# Patient Record
Sex: Male | Born: 1980 | Race: White | Hispanic: No | Marital: Married | Smoking: Never smoker
Health system: Southern US, Community
[De-identification: ages and names within clinical notes are randomized; demographics above are authoritative.]

## PROBLEM LIST (undated history)

## (undated) HISTORY — PX: SHOULDER SURGERY: SHX246

---

## 2013-03-31 ENCOUNTER — Encounter (HOSPITAL_BASED_OUTPATIENT_CLINIC_OR_DEPARTMENT_OTHER): Payer: Self-pay

## 2013-03-31 ENCOUNTER — Emergency Department (HOSPITAL_BASED_OUTPATIENT_CLINIC_OR_DEPARTMENT_OTHER)
Admission: EM | Admit: 2013-03-31 | Discharge: 2013-03-31 | Disposition: A | Discharge status: Home / Self Care | Payer: Managed Care, Other (non HMO) | Attending: Emergency Medicine | Admitting: Emergency Medicine

## 2013-03-31 ENCOUNTER — Emergency Department (HOSPITAL_BASED_OUTPATIENT_CLINIC_OR_DEPARTMENT_OTHER): Payer: Managed Care, Other (non HMO)

## 2013-03-31 DIAGNOSIS — Z880 Allergy status to penicillin: Secondary | ICD-10-CM | POA: Insufficient documentation

## 2013-03-31 DIAGNOSIS — R071 Chest pain on breathing: Secondary | ICD-10-CM | POA: Insufficient documentation

## 2013-03-31 DIAGNOSIS — R0789 Other chest pain: Secondary | ICD-10-CM

## 2013-03-31 LAB — CBC WITH DIFFERENTIAL/PLATELET
Hemoglobin: 16.9 g/dL (ref 13.0–17.0)
Lymphs Abs: 1.8 10*3/uL (ref 0.7–4.0)
MCH: 32.5 pg (ref 26.0–34.0)
Monocytes Relative: 7 % (ref 3–12)
Neutro Abs: 3.8 10*3/uL (ref 1.7–7.7)
Neutrophils Relative %: 61 % (ref 43–77)
RBC: 5.2 MIL/uL (ref 4.22–5.81)

## 2013-03-31 LAB — BASIC METABOLIC PANEL
BUN: 14 mg/dL (ref 6–23)
Chloride: 101 mEq/L (ref 96–112)
Glucose, Bld: 97 mg/dL (ref 70–99)
Potassium: 3.6 mEq/L (ref 3.5–5.1)

## 2013-03-31 MED ORDER — ASPIRIN 81 MG PO CHEW
324.0000 mg | CHEWABLE_TABLET | Freq: Once | ORAL | Status: AC
  Administered 2013-03-31: 324 mg via ORAL
  Filled 2013-03-31: qty 4

## 2013-03-31 NOTE — ED Notes (Signed)
Pt reports onset of chest pain and pain with taking a deep breath since yesterday.

## 2013-03-31 NOTE — ED Provider Notes (Signed)
History     CSN: [id number]  Arrival date & time 03/31/13  1135   First MD Initiated Contact with Patient 03/31/13 1144      Chief Complaint  Patient presents with  . Chest Pain    (Consider location/radiation/quality/duration/timing/severity/associated sxs/prior treatment) HPI Patient with several week history of left anterior upper chest pain. It is worse with certain movements. It is sharp and seconds in duration. Patient has not had any associated dyspnea, diaphoresis, or lightheadedness. He denies cough, fever, or chills. He denies any recent injury or heavy lifting. He has no risk factors for DVT. History reviewed. No pertinent past medical history.  Past Surgical History  Procedure Laterality Date  . Shoulder surgery      No family history on file.  History  Substance Use Topics  . Smoking status: Never Smoker   . Smokeless tobacco: Not on file  . Alcohol Use: No      Review of Systems  All other systems reviewed and are negative.    Allergies  Penicillins  Home Medications  No current outpatient prescriptions on file.  BP 133/92  Pulse 92  Temp(Src) 98.3 F (36.8 C) (Oral)  Resp 18  Ht 5\' 11"  (1.803 m)  Wt 220 lb (99.791 kg)  BMI 30.7 kg/m2  SpO2 97%  Physical Exam  Nursing note and vitals reviewed. Constitutional: He is oriented to person, place, and time. He appears well-developed and well-nourished.  HENT:  Head: Normocephalic and atraumatic.  Right Ear: External ear normal.  Left Ear: External ear normal.  Nose: Nose normal.  Mouth/Throat: Oropharynx is clear and moist.  Eyes: Conjunctivae and EOM are normal. Pupils are equal, round, and reactive to light.  Neck: Normal range of motion. Neck supple.  Cardiovascular: Normal rate, regular rhythm, normal heart sounds and intact distal pulses.   Pulmonary/Chest: Effort normal and breath sounds normal. No respiratory distress. He has no wheezes. He exhibits no tenderness.  Abdominal: Soft.  Bowel sounds are normal. He exhibits no distension and no mass. There is no tenderness. There is no guarding.  Musculoskeletal: Normal range of motion.  Neurological: He is alert and oriented to person, place, and time. He has normal reflexes. He exhibits normal muscle tone. Coordination normal.  Skin: Skin is warm and dry.  Psychiatric: He has a normal mood and affect. His behavior is normal. Judgment and thought content normal.    ED Course  Procedures (including critical care time)  Labs Reviewed  CBC WITH DIFFERENTIAL - Abnormal; Notable for the following:    MCHC 37.0 (*)    All other components within normal limits  BASIC METABOLIC PANEL  POCT I-STAT TROPONIN I   No results found.   No diagnosis found.   Date: 03/31/2013  Rate: 86  Rhythm: normal sinus rhythm  QRS Axis: normal  Intervals: normal  ST/T Wave abnormalities: normal  Conduction Disutrbances: none  Narrative Interpretation: unremarkable   Results for orders placed during the hospital encounter of 03/31/13  CBC WITH DIFFERENTIAL      Result Value Range   WBC 6.2  4.0 - 10.5 K/uL   RBC 5.20  4.22 - 5.81 MIL/uL   Hemoglobin 16.9  13.0 - 17.0 g/dL   HCT 46.9  62.9 - 52.8 %   MCV 87.9  78.0 - 100.0 fL   MCH 32.5  26.0 - 34.0 pg   MCHC 37.0 (*) 30.0 - 36.0 g/dL   RDW 41.3  24.4 - 01.0 %   Platelets 262  150 - 400 K/uL   Neutrophils Relative % 61  43 - 77 %   Neutro Abs 3.8  1.7 - 7.7 K/uL   Lymphocytes Relative 28  12 - 46 %   Lymphs Abs 1.8  0.7 - 4.0 K/uL   Monocytes Relative 7  3 - 12 %   Monocytes Absolute 0.5  0.1 - 1.0 K/uL   Eosinophils Relative 4  0 - 5 %   Eosinophils Absolute 0.2  0.0 - 0.7 K/uL   Basophils Relative 0  0 - 1 %   Basophils Absolute 0.0  0.0 - 0.1 K/uL  BASIC METABOLIC PANEL      Result Value Range   Sodium 140  135 - 145 mEq/L   Potassium 3.6  3.5 - 5.1 mEq/L   Chloride 101  96 - 112 mEq/L   CO2 26  19 - 32 mEq/L   Glucose, Bld 97  70 - 99 mg/dL   BUN 14  6 - 23 mg/dL    Creatinine, Ser 1.61  0.50 - 1.35 mg/dL   Calcium 9.4  8.4 - 09.6 mg/dL   GFR calc non Af Amer >90  >90 mL/min   GFR calc Af Amer >90  >90 mL/min  POCT I-STAT TROPONIN I      Result Value Range   Troponin i, poc 0.00  0.00 - 0.08 ng/mL   Comment 3            No results found.    Date: 03/31/2013  Rate: 86  Rhythm: normal sinus rhythm  QRS Axis: normal  Intervals: normal  ST/T Wave abnormalities: normal  Conduction Disutrbances: none  Narrative Interpretation: unremarkable     MDM  Patient chest pain c.w. Musculoskeletal source.  ekg normal.  Troponin normal after a week and patient improved here with aspirin.  Patient advised regarding close follow up and need for return.         Hilario Quarry, MD 03/31/13 [phone]

## 2013-03-31 NOTE — ED Notes (Signed)
Patient transported to X-ray 

## 2014-02-18 ENCOUNTER — Encounter (HOSPITAL_COMMUNITY): Payer: Self-pay | Admitting: Emergency Medicine

## 2014-02-18 DIAGNOSIS — Z79899 Other long term (current) drug therapy: Secondary | ICD-10-CM | POA: Insufficient documentation

## 2014-02-18 DIAGNOSIS — K612 Anorectal abscess: Secondary | ICD-10-CM | POA: Insufficient documentation

## 2014-02-18 DIAGNOSIS — Z880 Allergy status to penicillin: Secondary | ICD-10-CM | POA: Insufficient documentation

## 2014-02-18 NOTE — ED Notes (Signed)
Went to pcp last week they made a referral to urologists for a cysts in rectum.  No bm x 2 days.  No bleeding. Also, states, "urinating more."

## 2014-02-19 ENCOUNTER — Emergency Department (HOSPITAL_COMMUNITY)
Admission: EM | Admit: 2014-02-19 | Discharge: 2014-02-19 | Disposition: A | Discharge status: Home / Self Care | Payer: Managed Care, Other (non HMO) | Attending: Emergency Medicine | Admitting: Emergency Medicine

## 2014-02-19 ENCOUNTER — Encounter (HOSPITAL_COMMUNITY): Payer: Self-pay | Admitting: Radiology

## 2014-02-19 ENCOUNTER — Emergency Department (HOSPITAL_COMMUNITY): Payer: Managed Care, Other (non HMO)

## 2014-02-19 DIAGNOSIS — K610 Anal abscess: Secondary | ICD-10-CM

## 2014-02-19 LAB — CBC WITH DIFFERENTIAL/PLATELET
BASOS ABS: 0 10*3/uL (ref 0.0–0.1)
BASOS PCT: 0 % (ref 0–1)
EOS ABS: 0.1 10*3/uL (ref 0.0–0.7)
Eosinophils Relative: 1 % (ref 0–5)
HCT: 43.5 % (ref 39.0–52.0)
Hemoglobin: 15.4 g/dL (ref 13.0–17.0)
Lymphocytes Relative: 13 % (ref 12–46)
Lymphs Abs: 1.6 10*3/uL (ref 0.7–4.0)
MCH: 32.2 pg (ref 26.0–34.0)
MCHC: 35.4 g/dL (ref 30.0–36.0)
MCV: 90.8 fL (ref 78.0–100.0)
MONOS PCT: 8 % (ref 3–12)
Monocytes Absolute: 1 10*3/uL (ref 0.1–1.0)
NEUTROS ABS: 9.7 10*3/uL — AB (ref 1.7–7.7)
NEUTROS PCT: 78 % — AB (ref 43–77)
PLATELETS: 280 10*3/uL (ref 150–400)
RBC: 4.79 MIL/uL (ref 4.22–5.81)
RDW: 12.4 % (ref 11.5–15.5)
WBC: 12.5 10*3/uL — ABNORMAL HIGH (ref 4.0–10.5)

## 2014-02-19 LAB — BASIC METABOLIC PANEL
BUN: 19 mg/dL (ref 6–23)
CHLORIDE: 102 meq/L (ref 96–112)
CO2: 24 mEq/L (ref 19–32)
Calcium: 9.3 mg/dL (ref 8.4–10.5)
Creatinine, Ser: 1.04 mg/dL (ref 0.50–1.35)
Glucose, Bld: 108 mg/dL — ABNORMAL HIGH (ref 70–99)
POTASSIUM: 4.5 meq/L (ref 3.7–5.3)
Sodium: 140 mEq/L (ref 137–147)

## 2014-02-19 LAB — URINALYSIS, ROUTINE W REFLEX MICROSCOPIC
BILIRUBIN URINE: NEGATIVE
Glucose, UA: NEGATIVE mg/dL
Hgb urine dipstick: NEGATIVE
KETONES UR: NEGATIVE mg/dL
LEUKOCYTES UA: NEGATIVE
NITRITE: NEGATIVE
PH: 5.5 (ref 5.0–8.0)
Protein, ur: NEGATIVE mg/dL
UROBILINOGEN UA: 0.2 mg/dL (ref 0.0–1.0)

## 2014-02-19 MED ORDER — LORAZEPAM 2 MG/ML IJ SOLN
1.0000 mg | Freq: Once | INTRAMUSCULAR | Status: AC
  Administered 2014-02-19: 1 mg via INTRAVENOUS
  Filled 2014-02-19: qty 1

## 2014-02-19 MED ORDER — IOHEXOL 300 MG/ML  SOLN
100.0000 mL | Freq: Once | INTRAMUSCULAR | Status: AC | PRN
  Administered 2014-02-19: 100 mL via INTRAVENOUS

## 2014-02-19 MED ORDER — IOHEXOL 300 MG/ML  SOLN: 25.0000 mL | Freq: Once | INTRAMUSCULAR | Status: DC | PRN

## 2014-02-19 MED ORDER — MORPHINE SULFATE 4 MG/ML IJ SOLN
4.0000 mg | Freq: Once | INTRAMUSCULAR | Status: AC
  Administered 2014-02-19: 4 mg via INTRAVENOUS
  Filled 2014-02-19: qty 1

## 2014-02-19 MED ORDER — DOCUSATE SODIUM 100 MG PO CAPS: 100.0000 mg | ORAL_CAPSULE | Freq: Two times a day (BID) | ORAL | Status: AC

## 2014-02-19 MED ORDER — HYDROMORPHONE HCL PF 1 MG/ML IJ SOLN
0.5000 mg | Freq: Once | INTRAMUSCULAR | Status: AC
  Administered 2014-02-19: 0.5 mg via INTRAVENOUS
  Filled 2014-02-19: qty 1

## 2014-02-19 MED ORDER — ONDANSETRON HCL 4 MG/2ML IJ SOLN
4.0000 mg | Freq: Once | INTRAMUSCULAR | Status: AC
  Administered 2014-02-19: 4 mg via INTRAVENOUS
  Filled 2014-02-19: qty 2

## 2014-02-19 MED ORDER — OXYCODONE-ACETAMINOPHEN 5-325 MG PO TABS: 2.0000 | ORAL_TABLET | ORAL | Status: AC | PRN

## 2014-02-19 NOTE — ED Notes (Signed)
Pt currently in CT.

## 2014-02-19 NOTE — ED Provider Notes (Signed)
CSN: [id number]     Arrival date & time 02/18/14  2227 History   First MD Initiated Contact with Patient 02/19/14 0113     Chief Complaint  Patient presents with  . Abscess     (Consider location/radiation/quality/duration/timing/severity/associated sxs/prior Treatment) HPI 33 year old male presents to the emergency department from home with complaint of increasing pain in his rectum.  Patient was seen by his primary care doctor last week, diagnosed with a perirectal mass.  He was referred to a specialist, has not seen them yet.  Pain has been increasing.  He's been unable to have a bowel movement for 2 days.  He has had low-grade fevers.  No prior history of same.  He is otherwise healthy.  No prior surgeries. History reviewed. No pertinent past medical history. Past Surgical History  Procedure Laterality Date  . Shoulder surgery     History reviewed. No pertinent family history. History  Substance Use Topics  . Smoking status: Never Smoker   . Smokeless tobacco: Not on file  . Alcohol Use: No    Review of Systems   See History of Present Illness; otherwise all other systems are reviewed and negative  Allergies  Penicillins  Home Medications   Prior to Admission medications   Medication Sig Start Date End Date Taking? Authorizing Provider  oseltamivir (TAMIFLU) 75 MG capsule Take 75 mg by mouth 2 (two) times daily. 02/16/14 02/26/14 Yes Historical Provider, MD   BP 124/83  Pulse 101  Temp(Src) 99.3 F (37.4 C) (Oral)  Resp 28  SpO2 100% Physical Exam  Nursing note and vitals reviewed. Constitutional: He is oriented to person, place, and time. He appears well-developed and well-nourished. He appears distressed.  Patient lying on his stomach, unable to tolerate laying on his back  HENT:  Head: Normocephalic and atraumatic.  Nose: Nose normal.  Mouth/Throat: Oropharynx is clear and moist.  Eyes: Conjunctivae and EOM are normal. Pupils are equal, round, and reactive to  light.  Neck: Normal range of motion. Neck supple. No JVD present. No tracheal deviation present. No thyromegaly present.  Cardiovascular: Normal rate, regular rhythm, normal heart sounds and intact distal pulses.  Exam reveals no gallop and no friction rub.   No murmur heard. Pulmonary/Chest: Effort normal and breath sounds normal. No stridor. No respiratory distress. He has no wheezes. He has no rales. He exhibits no tenderness.  Abdominal: Soft. Bowel sounds are normal. He exhibits no distension and no mass. There is no tenderness. There is no rebound and no guarding.  Genitourinary:  Patient reports pain to left side of rectum.  Small swelling appreciated, deep within the rectum.  Patient with severe pain on exam.  Small mass at 7oclock perianal area, also tender  Musculoskeletal: Normal range of motion. He exhibits no edema and no tenderness.  Lymphadenopathy:    He has no cervical adenopathy.  Neurological: He is alert and oriented to person, place, and time. He exhibits normal muscle tone. Coordination normal.  Skin: Skin is warm and dry. No rash noted. No erythema. No pallor.  Psychiatric: He has a normal mood and affect. His behavior is normal. Judgment and thought content normal.    ED Course  Procedures (including critical care time) Labs Review Labs Reviewed  CBC WITH DIFFERENTIAL - Abnormal; Notable for the following:    WBC 12.5 (*)    Neutrophils Relative % 78 (*)    Neutro Abs 9.7 (*)    All other components within normal limits  BASIC METABOLIC  PANEL - Abnormal; Notable for the following:    Glucose, Bld 108 (*)    All other components within normal limits    Imaging Review No results found.   EKG Interpretation None      MDM   Final diagnoses:  Perianal abscess    33 year old male with one week of rectal pain, worsening over the last day.  Rectal exam with significant tenderness, but no appreciable mass noted internally, concern for possible  fistula/perianal communicating with perirectal abscess.  Plan for CT abdomen pelvis.  Will give pain and nausea medicine.  2:55 AM CT scan without abscess noted.  Maybe with slight increase in prostate per radiologist.  Will I&D perianal area.  4:28 AM INCISION AND DRAINAGE Performed by: Olivia Mackielga M Chantele Corado Consent: Verbal consent obtained. Risks and benefits: risks, benefits and alternatives were discussed Type: abscess  Body area: left perianal region Anesthesia: local infiltration  Incision was made with a scalpel.  Local anesthetic: lidocaine 2% without epinephrine  Anesthetic total: 10 ml  Complexity: complex Blunt dissection to break up loculations  Drainage: purulent  Drainage amount: large  Packing material: wound packed at entrance of incision for hemostasis with corner of 4x4  Patient tolerance: Patient tolerated the procedure well with no immediate complications.  Culture sent      Olivia Mackielga M Lakeith Careaga, MD 02/19/14 [phone]

## 2014-02-19 NOTE — Discharge Instructions (Signed)
Peri-Rectal Abscess °Your caregiver has diagnosed you as having a peri-rectal abscess. This is an infected area near the rectum that is filled with pus. If the abscess is near the surface of the skin, your caregiver may open (incise) the area and drain the pus. °HOME CARE INSTRUCTIONS  °· If your abscess was opened up and drained. A small piece of gauze may be placed in the opening so that it can drain. Do not remove the gauze unless directed by your caregiver. °· A loose dressing may be placed over the abscess site. Change the dressing as often as necessary to keep it clean and dry. °· After the drain is removed, the area may be washed with a gentle antiseptic (soap) four times per day. °· A warm sitz bath, warm packs or heating pad may be used for pain relief, taking care not to burn yourself. °· Return for a wound check in 1 day or as directed. °· An "inflatable doughnut" may be used for sitting with added comfort. These can be purchased at a drugstore or medical supply house. °· To reduce pain and straining with bowel movements, eat a high fiber diet with plenty of fruits and vegetables. Use stool softeners as recommended by your caregiver. This is especially important if narcotic type pain medications were prescribed as these may cause marked constipation. °· Only take over-the-counter or prescription medicines for pain, discomfort, or fever as directed by your caregiver. °SEEK IMMEDIATE MEDICAL CARE IF:  °· You have increasing pain that is not controlled by medication. °· There is increased inflammation (redness), swelling, bleeding, or drainage from the area. °· An oral temperature above 102° F (38.9° C) develops. °· You develop chills or generalized malaise (feel lethargic or feel "washed out"). °· You develop any new symptoms (problems) you feel may be related to your present problem. °Document Released: 10/19/2000 Document Revised: 01/14/2012 Document Reviewed: 10/19/2008 °[employer]® Patient Information  ©2014 [employer]. ° °

## 2014-02-21 ENCOUNTER — Encounter (HOSPITAL_COMMUNITY): Payer: Self-pay | Admitting: Emergency Medicine

## 2014-02-21 ENCOUNTER — Emergency Department (HOSPITAL_COMMUNITY)
Admission: EM | Admit: 2014-02-21 | Discharge: 2014-02-21 | Disposition: A | Discharge status: Home / Self Care | Payer: Managed Care, Other (non HMO) | Attending: Emergency Medicine | Admitting: Emergency Medicine

## 2014-02-21 DIAGNOSIS — Z5189 Encounter for other specified aftercare: Secondary | ICD-10-CM

## 2014-02-21 DIAGNOSIS — Z4800 Encounter for change or removal of nonsurgical wound dressing: Secondary | ICD-10-CM | POA: Insufficient documentation

## 2014-02-21 DIAGNOSIS — Z880 Allergy status to penicillin: Secondary | ICD-10-CM | POA: Insufficient documentation

## 2014-02-21 DIAGNOSIS — Z79899 Other long term (current) drug therapy: Secondary | ICD-10-CM | POA: Insufficient documentation

## 2014-02-21 NOTE — ED Provider Notes (Signed)
Medical screening examination/treatment/procedure(s) were performed by non-physician practitioner and as supervising physician I was immediately available for consultation/collaboration.   EKG Interpretation None        William Zaylei Mullane, MD 02/21/14 1126 

## 2014-02-21 NOTE — ED Provider Notes (Signed)
CSN: [id number]     Arrival date & time 02/21/14  1020 History  This chart was scribed for non-physician practitioner Christopher Rush, working with Dagmar HaitWilliam Blair Walden, MD by Carl Bestelina Holson, ED Scribe. This patient was seen in room TR08C/TR08C and the patient's care was started at 10:41 AM.    Chief Complaint  Patient presents with  . Wound Check    Patient is a 33 y.o. male presenting with wound check. The history is provided by the patient. No language interpreter was used.  Wound Check   HPI Comments: Christopher Rush is a 33 y.o. male who presents to the Emergency Department for evaluation of a rectal abscess.  The patient states that he is feeling much better and there is little to no pain.  He states that the wound is not packed but there is a gauze over the area.  He states that he has not needed the pain medication he was prescribed.     History reviewed. No pertinent past medical history. Past Surgical History  Procedure Laterality Date  . Shoulder surgery     History reviewed. No pertinent family history. History  Substance Use Topics  . Smoking status: Never Smoker   . Smokeless tobacco: Not on file  . Alcohol Use: No    Review of Systems  Skin: Positive for wound (rectal abscess).  All other systems reviewed and are negative.     Allergies  Penicillins  Home Medications   Prior to Admission medications   Medication Sig Start Date End Date Taking? Authorizing Provider  docusate sodium (COLACE) 100 MG capsule Take 1 capsule (100 mg total) by mouth 2 (two) times daily. 02/19/14   Olivia Mackielga M Otter, MD  oseltamivir (TAMIFLU) 75 MG capsule Take 75 mg by mouth 2 (two) times daily. 02/16/14 02/26/14  Historical Provider, MD  oxyCODONE-acetaminophen (PERCOCET/ROXICET) 5-325 MG per tablet Take 2 tablets by mouth every 4 (four) hours as needed for severe pain. 02/19/14   Olivia Mackielga M Otter, MD   Triage Vitals: BP 130/93  Pulse 72  Temp(Src) 97.8 F (36.6 C) (Oral)  Resp 18  Ht 5'  10" (1.778 m)  Wt 225 lb (102.059 kg)  BMI 32.28 kg/m2  SpO2 99%  Physical Exam  Nursing note and vitals reviewed. Constitutional: He is oriented to person, place, and time. He appears well-developed and well-nourished.  HENT:  Head: Normocephalic and atraumatic.  Eyes: EOM are normal.  Neck: Normal range of motion.  Cardiovascular: Normal rate.   Pulmonary/Chest: Effort normal.  Musculoskeletal: Normal range of motion.  Neurological: He is alert and oriented to person, place, and time.  Skin: Skin is warm and dry.  Healing perirectal wound. No drainage noted.   Psychiatric: He has a normal mood and affect. His behavior is normal.    ED Course  Procedures (including critical care time)  DIAGNOSTIC STUDIES: Oxygen Saturation is 99% on room air, normal by my interpretation.    COORDINATION OF CARE: 10:41 AM- Discussed discharging the patient.  The patient agreed to the treatment plan.   Labs Review Labs Reviewed - No data to display  Imaging Review No results found.   EKG Interpretation None      MDM   Final diagnoses:  Wound check, abscess    10:44 AM Patient's wound appears to be healing. Patient reports complete relief of pain. Vitals stable and patient afebrile.   I personally performed the services described in this documentation, which was scribed in my presence. The recorded information  has been reviewed and is accurate.    Christopher BeckKaitlyn Quinzell Malcomb, PA-C 02/21/14 1046

## 2014-02-21 NOTE — ED Notes (Signed)
Pt presents to department for evaluation of wound re-check. States rectal abscess, denies pain at the time.

## 2014-02-22 LAB — CULTURE, ROUTINE-ABSCESS

## 2021-01-16 ENCOUNTER — Emergency Department (HOSPITAL_COMMUNITY)
Admission: EM | Admit: 2021-01-16 | Discharge: 2021-01-16 | Disposition: A | Discharge status: Home / Self Care | Payer: No Typology Code available for payment source | Attending: Emergency Medicine | Admitting: Emergency Medicine

## 2021-01-16 ENCOUNTER — Encounter (HOSPITAL_COMMUNITY): Payer: Self-pay | Admitting: *Deleted

## 2021-01-16 ENCOUNTER — Other Ambulatory Visit: Payer: Self-pay

## 2021-01-16 ENCOUNTER — Emergency Department (HOSPITAL_COMMUNITY): Payer: No Typology Code available for payment source

## 2021-01-16 DIAGNOSIS — R42 Dizziness and giddiness: Secondary | ICD-10-CM | POA: Insufficient documentation

## 2021-01-16 DIAGNOSIS — N132 Hydronephrosis with renal and ureteral calculous obstruction: Secondary | ICD-10-CM | POA: Diagnosis not present

## 2021-01-16 DIAGNOSIS — R109 Unspecified abdominal pain: Secondary | ICD-10-CM | POA: Diagnosis present

## 2021-01-16 DIAGNOSIS — N200 Calculus of kidney: Secondary | ICD-10-CM

## 2021-01-16 LAB — URINALYSIS, ROUTINE W REFLEX MICROSCOPIC
Bilirubin Urine: NEGATIVE
Glucose, UA: NEGATIVE mg/dL
Ketones, ur: NEGATIVE mg/dL
Nitrite: NEGATIVE
Protein, ur: 30 mg/dL — AB
RBC / HPF: >50 RBC/hpf — ABNORMAL HIGH (ref 0–5)
Specific Gravity, Urine: 1.019 (ref 1.005–1.030)
pH: 6 (ref 5.0–8.0)

## 2021-01-16 LAB — COMPREHENSIVE METABOLIC PANEL
ALT: 36 U/L (ref 0–44)
AST: 27 U/L (ref 15–41)
Albumin: 4.7 g/dL (ref 3.5–5.0)
Alkaline Phosphatase: 59 U/L (ref 38–126)
Anion gap: 13 (ref 5–15)
BUN: 19 mg/dL (ref 6–20)
CO2: 25 mmol/L (ref 22–32)
Calcium: 9.4 mg/dL (ref 8.9–10.3)
Chloride: 101 mmol/L (ref 98–111)
Creatinine, Ser: 1.14 mg/dL (ref 0.61–1.24)
GFR, Estimated: >60 mL/min (ref >60)
Glucose, Bld: 140 mg/dL — ABNORMAL HIGH (ref 70–99)
Potassium: 4 mmol/L (ref 3.5–5.1)
Sodium: 139 mmol/L (ref 135–145)
Total Bilirubin: 1.1 mg/dL (ref 0.3–1.2)
Total Protein: 7.8 g/dL (ref 6.5–8.1)

## 2021-01-16 LAB — CBC WITH DIFFERENTIAL/PLATELET
Abs Immature Granulocytes: 0.08 10*3/uL — ABNORMAL HIGH (ref 0.00–0.07)
Basophils Absolute: 0.1 10*3/uL (ref 0.0–0.1)
Basophils Relative: 0 %
Eosinophils Absolute: 0.1 10*3/uL (ref 0.0–0.5)
Eosinophils Relative: 1 %
HCT: 49.5 % (ref 39.0–52.0)
Hemoglobin: 17.2 g/dL — ABNORMAL HIGH (ref 13.0–17.0)
Immature Granulocytes: 1 %
Lymphocytes Relative: 11 %
Lymphs Abs: 1.4 10*3/uL (ref 0.7–4.0)
MCH: 32 pg (ref 26.0–34.0)
MCHC: 34.7 g/dL (ref 30.0–36.0)
MCV: 92.2 fL (ref 80.0–100.0)
Monocytes Absolute: 0.7 10*3/uL (ref 0.1–1.0)
Monocytes Relative: 5 %
Neutro Abs: 10.3 10*3/uL — ABNORMAL HIGH (ref 1.7–7.7)
Neutrophils Relative %: 82 %
Platelets: 273 10*3/uL (ref 150–400)
RBC: 5.37 MIL/uL (ref 4.22–5.81)
RDW: 12.6 % (ref 11.5–15.5)
WBC: 12.5 10*3/uL — ABNORMAL HIGH (ref 4.0–10.5)
nRBC: 0 % (ref 0.0–0.2)

## 2021-01-16 MED ORDER — SULFAMETHOXAZOLE-TRIMETHOPRIM 800-160 MG PO TABS: 1.0000 | ORAL_TABLET | Freq: Two times a day (BID) | ORAL | 0 refills | Status: AC

## 2021-01-16 MED ORDER — TAMSULOSIN HCL 0.4 MG PO CAPS: 0.4000 mg | ORAL_CAPSULE | Freq: Every day | ORAL | 0 refills | Status: AC

## 2021-01-16 MED ORDER — SODIUM CHLORIDE 0.9 % IV BOLUS
1000.0000 mL | Freq: Once | INTRAVENOUS | Status: AC
  Administered 2021-01-16: 1000 mL via INTRAVENOUS

## 2021-01-16 MED ORDER — ONDANSETRON HCL 4 MG/2ML IJ SOLN
4.0000 mg | Freq: Once | INTRAMUSCULAR | Status: AC
  Administered 2021-01-16: 4 mg via INTRAVENOUS
  Filled 2021-01-16: qty 2

## 2021-01-16 MED ORDER — IBUPROFEN 600 MG PO TABS: 600.0000 mg | ORAL_TABLET | Freq: Four times a day (QID) | ORAL | 0 refills | Status: AC | PRN

## 2021-01-16 MED ORDER — ONDANSETRON 4 MG PO TBDP: 4.0000 mg | ORAL_TABLET | Freq: Three times a day (TID) | ORAL | 0 refills | Status: AC | PRN

## 2021-01-16 MED ORDER — HYDROCODONE-ACETAMINOPHEN 5-325 MG PO TABS: 1.0000 | ORAL_TABLET | Freq: Four times a day (QID) | ORAL | 0 refills | Status: AC | PRN

## 2021-01-16 MED ORDER — KETOROLAC TROMETHAMINE 30 MG/ML IJ SOLN
30.0000 mg | Freq: Once | INTRAMUSCULAR | Status: AC
  Administered 2021-01-16: 30 mg via INTRAVENOUS
  Filled 2021-01-16: qty 1

## 2021-01-16 MED ORDER — TAMSULOSIN HCL 0.4 MG PO CAPS
0.4000 mg | ORAL_CAPSULE | Freq: Once | ORAL | Status: AC
  Administered 2021-01-16: 0.4 mg via ORAL
  Filled 2021-01-16: qty 1

## 2021-01-16 NOTE — Discharge Instructions (Addendum)
Please read the attachment on kidney stones.  You had a very small 1 mm stone that was obstructing.  It should pass soon.  Please take ibuprofen 600 mg every 6 hours as needed for pain control.  Please take the Norco for breakthrough pain.  Do not drive if taking the narcotic pain medication.  I would like you to take Zofran ODT as needed for nausea symptoms.  The tamsulosin (Flomax) should help you pass your stone.  Drink plenty of fluids.  I have also prescribed you Bactrim x7 days given possible infection.  Please call Dr. Ronne Binning with [facility] and schedule appointment in 3 days for follow-up.  Return to the ED or seek immediate medical attention should experience any worsening symptoms.

## 2021-01-16 NOTE — ED Triage Notes (Signed)
Pt with right flank pain for past hour.  Pt with nausea and not able to urinate much.  Unable to obtain an oral ot temporal temperature in triage.

## 2021-01-16 NOTE — ED Provider Notes (Signed)
[facility] EMERGENCY DEPARTMENT Provider Note   CSN: [id number] Arrival date & time: 01/16/21  1418     History Chief Complaint  Patient presents with   Flank Pain    Christopher Rush is a 40 y.o. male patient with no past medical history presents the ED with a 1 hour history of right-sided flank pain.  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  He describes it as 7 out of 10 pain radiating from his right flank to into his right lower quadrant abdomen.  Denies any history of prior.  No family history of kidney stones.  He does not take any medications regularly and does not have any significant past medical history.  No history of abdominal surgeries.  He states the pain made him feel lightheaded and nauseated.  Continues to be in discomfort on my exam.  Denies any recent numbness or infection, fevers or chills, pain with defecation or other changes in his bowel habits, chest pain or shortness of breath, cough, or any other symptoms.  HPI     History reviewed. No pertinent past medical history.  There are no problems to display for this patient.   Past Surgical History:  Procedure Laterality Date   SHOULDER SURGERY         History reviewed. No pertinent family history.  Social History   Tobacco Use   Smoking status: Never Smoker   Smokeless tobacco: Never Used  Substance Use Topics   Alcohol use: No   Drug use: No    Home Medications Prior to Admission medications   Medication Sig Start Date End Date Taking? Authorizing Provider  HYDROcodone-acetaminophen (NORCO/VICODIN) 5-325 MG tablet Take 1 tablet by mouth every 6 (six) hours as needed for up to 5 doses for severe pain. 01/16/21  Yes Lorelee New, PA-C  ibuprofen (ADVIL) 600 MG tablet Take 1 tablet (600 mg total) by mouth every 6 (six) hours as needed. 01/16/21  Yes Lorelee New,  PA-C  ondansetron (ZOFRAN ODT) 4 MG disintegrating tablet Take 1 tablet (4 mg total) by mouth every 8 (eight) hours as needed for nausea or vomiting. 01/16/21  Yes Lorelee New, PA-C  sulfamethoxazole-trimethoprim (BACTRIM DS) 800-160 MG tablet Take 1 tablet by mouth 2 (two) times daily for 7 days. 01/16/21 01/23/21 Yes Lorelee New, PA-C  tamsulosin (FLOMAX) 0.4 MG CAPS capsule Take 1 capsule (0.4 mg total) by mouth daily for 10 days. 01/16/21 01/26/21 Yes Lorelee New, PA-C  docusate sodium (COLACE) 100 MG capsule Take 1 capsule (100 mg total) by mouth 2 (two) times daily. Patient not taking: Reported on 01/16/2021 02/19/14   Marisa Severin, MD  fexofenadine (ALLEGRA) 180 MG tablet Take by mouth.    [provider]  oxyCODONE-acetaminophen (PERCOCET/ROXICET) 5-325 MG per tablet Take 2 tablets by mouth every 4 (four) hours as needed for severe pain. Patient not taking: Reported on 01/16/2021 02/19/14   Marisa Severin, MD  PROCTOSOL HC 2.5 % rectal cream Place 1 application rectally 2 (two) times daily. Patient not taking: Reported on 01/16/2021 02/17/14   [provider]    Allergies    Penicillins  Review of Systems   Review of Systems  All other systems reviewed and are negative.   Physical Exam Updated Vital Signs BP (!) 141/81    Pulse 86    Temp 98.3 F (36.8 C) (Oral)  Resp 18    Ht 5\' 10"  (1.778 m)    Wt 104.3 kg    SpO2 99%    BMI 33.00 kg/m   Physical Exam Vitals and nursing note reviewed. Exam conducted with a chaperone present.  Constitutional:      Appearance: He is not toxic-appearing.     Comments: In discomfort.  HENT:     Head: Normocephalic and atraumatic.  Eyes:     General: No scleral icterus.    Conjunctiva/sclera: Conjunctivae normal.  Cardiovascular:     Rate and Rhythm: Normal rate.     Pulses: Normal pulses.  Pulmonary:     Effort: Pulmonary effort is normal. No respiratory distress.  Abdominal:     General: Abdomen is flat. There is  no distension.     Palpations: Abdomen is soft.     Tenderness: There is abdominal tenderness. There is right CVA tenderness. There is no left CVA tenderness or guarding.     Comments: Soft, nondistended.  Significant right-sided CVAT.  Mild right lower quadrant abdominal TTP on exam.  No other areas of tenderness.  No guarding.  No overlying skin changes.  Musculoskeletal:     Cervical back: Normal range of motion.  Skin:    General: Skin is dry.  Neurological:     Mental Status: He is alert and oriented to person, place, and time.     GCS: GCS eye subscore is 4. GCS verbal subscore is 5. GCS motor subscore is 6.  Psychiatric:        Mood and Affect: Mood normal.        Behavior: Behavior normal.        Thought Content: Thought content normal.     ED Results / Procedures / Treatments   Labs (all labs ordered are listed, but only abnormal results are displayed) Labs Reviewed  URINALYSIS, ROUTINE W REFLEX MICROSCOPIC - Abnormal; Notable for the following components:      Result Value   APPearance HAZY (*)    Hgb urine dipstick LARGE (*)    Protein, ur 30 (*)    Leukocytes,Ua MODERATE (*)    RBC / HPF >50 (*)    Bacteria, UA RARE (*)    All other components within normal limits  CBC WITH DIFFERENTIAL/PLATELET - Abnormal; Notable for the following components:   WBC 12.5 (*)    Hemoglobin 17.2 (*)    Neutro Abs 10.3 (*)    Abs Immature Granulocytes 0.08 (*)    All other components within normal limits  COMPREHENSIVE METABOLIC PANEL - Abnormal; Notable for the following components:   Glucose, Bld 140 (*)    All other components within normal limits  URINE CULTURE    EKG None  Radiology CT Renal Stone Study  Result Date: 01/16/2021 CLINICAL DATA:  Right flank pain EXAM: CT ABDOMEN AND PELVIS WITHOUT CONTRAST TECHNIQUE: Multidetector CT imaging of the abdomen and pelvis was performed following the standard protocol without oral or IV contrast. COMPARISON:  February 19, 2014  FINDINGS: Lower chest: There is bibasilar atelectatic change. No edema or consolidation in the lung bases. Hepatobiliary: No focal liver lesions are appreciable on this noncontrast enhanced study. The gallbladder wall is not appreciably thickened. There is no biliary duct dilatation. Pancreas: There is no pancreatic mass or inflammatory focus. Spleen: No splenic lesions are evident. Small splenules are noted anteriorly. Adrenals/Urinary Tract: Adrenals bilaterally appear normal. There is no renal mass on either side. There is mild hydronephrosis on the right.  There is no appreciable hydronephrosis on the left. There is no intrarenal calculus on either side. There is subtle edema tracking along the course the right ureter. There is a 1 mm calculus at the right ureterovesical junction. No other ureteral calculi are evident. Urinary bladder is midline with wall thickness within normal limits. Stomach/Bowel: There are sigmoid diverticula without diverticulitis. There is no appreciable bowel wall or mesenteric thickening. There is no appreciable bowel obstruction. The terminal ileum appears normal. Appendix appears normal. There is no evident free air or portal venous air. Vascular/Lymphatic: There is no abdominal aortic aneurysm. No vascular lesions are evident on this noncontrast enhanced study. There is no appreciable adenopathy in the abdomen or pelvis. Reproductive: There are prostatic calculi. Prostate and seminal vesicles are normal in size and contour. Other: No evident abscess or ascites in the abdomen or pelvis. Musculoskeletal: There are no blastic or lytic bone lesions. There is no intramuscular or abdominal wall lesion. IMPRESSION: 1. 1 mm calculus right ureterovesical junction with mild hydronephrosis and ureterectasis on the right. Subtle edema tracking along the course of the right ureter. 2. Sigmoid diverticula without diverticulitis. No bowel wall thickening. No bowel obstruction. 3.  No abscess in the  abdomen or pelvis.  Appendix appears normal. Electronically Signed   By: Bretta Bang III M.D.   On: 01/16/2021 15:53    Procedures Procedures   Medications Ordered in ED Medications  sodium chloride 0.9 % bolus 1,000 mL (0 mLs Intravenous Stopped 01/16/21 1630)  ketorolac (TORADOL) 30 MG/ML injection 30 mg (30 mg Intravenous Given 01/16/21 1515)  ondansetron (ZOFRAN) injection 4 mg (4 mg Intravenous Given 01/16/21 1515)  tamsulosin (FLOMAX) capsule 0.4 mg (0.4 mg Oral Given 01/16/21 1645)    ED Course  I have reviewed the triage vital signs and the nursing notes.  Pertinent labs & imaging results that were available during my care of the patient were reviewed by me and considered in my medical decision making (see chart for details).    MDM Rules/Calculators/A&P                          JERMIE HIPPE was evaluated in Emergency Department on 01/16/2021 for the symptoms described in the history of present illness. He was evaluated in the context of the global COVID-19 pandemic, which necessitated consideration that the patient might be at risk for infection with the SARS-CoV-2 virus that causes COVID-19. Institutional protocols and algorithms that pertain to the evaluation of patients at risk for COVID-19 are in a state of rapid change based on information released by regulatory bodies including the CDC and federal and state organizations. These policies and algorithms were followed during the patient's care in the ED.  I personally reviewed patient's medical chart and all notes from triage and staff during today's encounter. I have also ordered and reviewed all labs and imaging that I felt to be medically necessary in the evaluation of this patient's complaints and with consideration of their physical exam. If needed, translation services were available and utilized.   History and physical exam concerning for obstructing ureterolithiasis.  No history of prior.  Patient also with mild  right lower quadrant abdominal tenderness.  Will obtain CT renal stone study for further evaluation.    CMP with normal renal function.  CBC with mild leukocytosis to 12.5, otherwise unremarkable.    CT renal stone study notable for 1 mm calculus at right UVJ with mild right-sided hydronephrosis.  There is also subtle edema tracking along the course of the right ureter.  Sigmoid diverticula noted on CT incidentally without evidence of diverticulitis.  On subsequent evaluation, patient states that his pain has improved since Toradol administration.  Patient to be given Flomax given that he has been unable to provide us with a urine sample yet.  UA is obtained and concerning for possibly mild infection.  I spoke with Dr. Ronne BinningMcKenzie, [facility], who recommended Bactrim x7 days and follow-up with him in the office in 3 days.  Patient is pain-free, afebrile.  ED return precautions discussed.  Patient voices understanding and is agreeable to the plan.   Final Clinical Impression(s) / ED Diagnoses Final diagnoses:  Kidney stone on right side    Rx / DC Orders ED Discharge Orders         Ordered    ondansetron (ZOFRAN ODT) 4 MG disintegrating tablet  Every 8 hours PRN        01/16/21 1853    ibuprofen (ADVIL) 600 MG tablet  Every 6 hours PRN        01/16/21 1853    tamsulosin (FLOMAX) 0.4 MG CAPS capsule  Daily        01/16/21 1853    HYDROcodone-acetaminophen (NORCO/VICODIN) 5-325 MG tablet  Every 6 hours PRN        01/16/21 1853    sulfamethoxazole-trimethoprim (BACTRIM DS) 800-160 MG tablet  2 times daily        01/16/21 1854           Lorelee NewGreen, Haifa Hatton L, PA-C 01/16/21 1854    Vanetta MuldersZackowski, Scott, MD 01/24/21 [phone]

## 2021-01-18 LAB — URINE CULTURE: Culture: <10000 — AB

## 2021-11-14 IMAGING — CT CT RENAL STONE PROTOCOL
2 of 4 series · 16 of 46 positions shown, 18 images · non-contrast
Comparison: February 19, 2014

CLINICAL DATA: Right flank pain

EXAM:
CT ABDOMEN AND PELVIS WITHOUT CONTRAST
TECHNIQUE: Multidetector CT imaging of the abdomen and pelvis was performed
following the standard protocol without oral or IV contrast.

[Series 2: axial st · axial · 0.93mm/px · z∈[+818,+1308]mm · 13 of 114 slices shown, 15 images]
[im 8/114  soft-tissue]
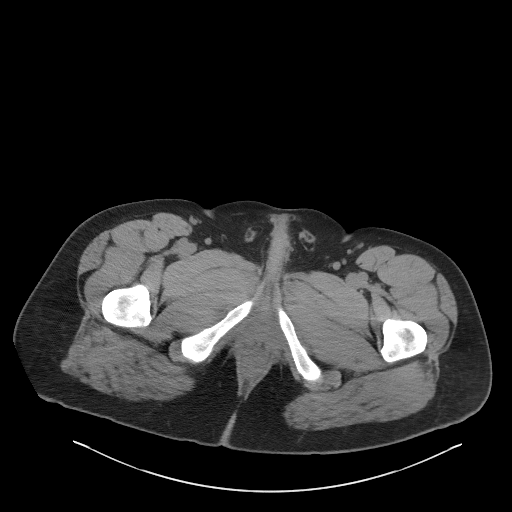
[im 8/114  bone]
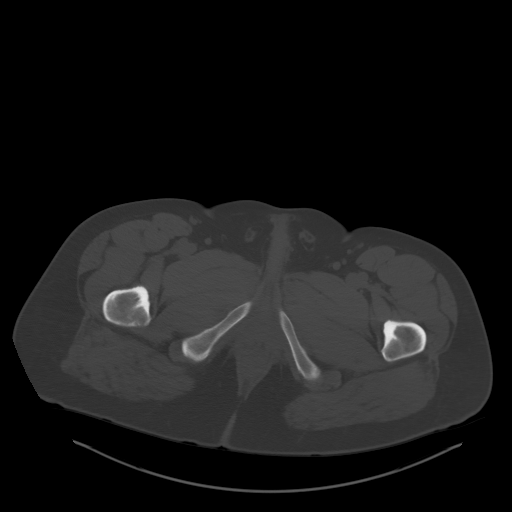
[im 15/114  soft-tissue]
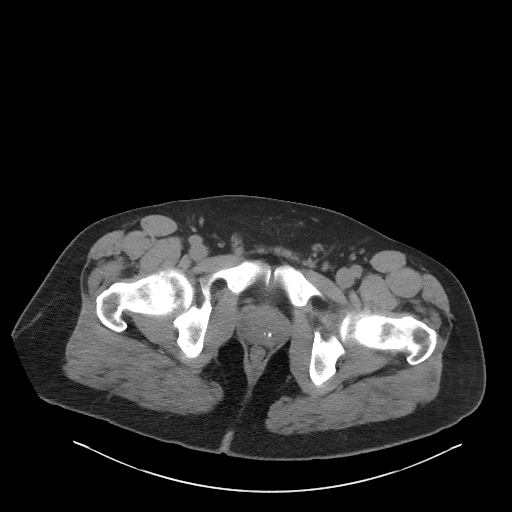
[im 22/114  soft-tissue]
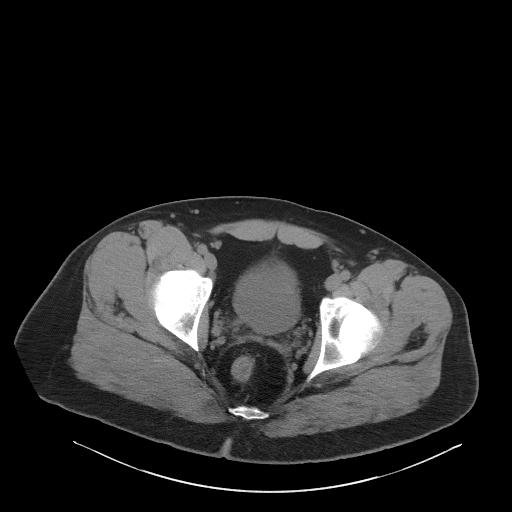
[im 36/114  soft-tissue]
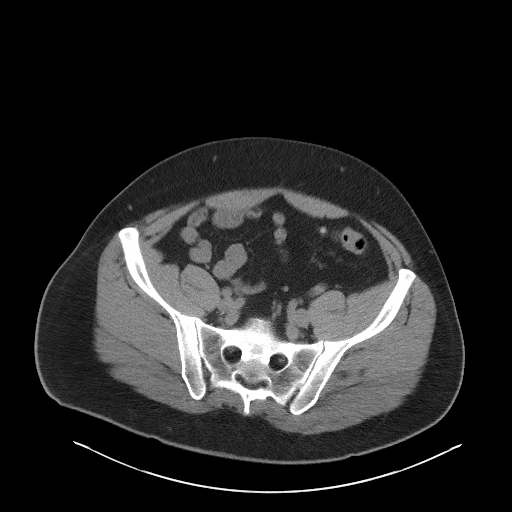
[im 43/114  soft-tissue]
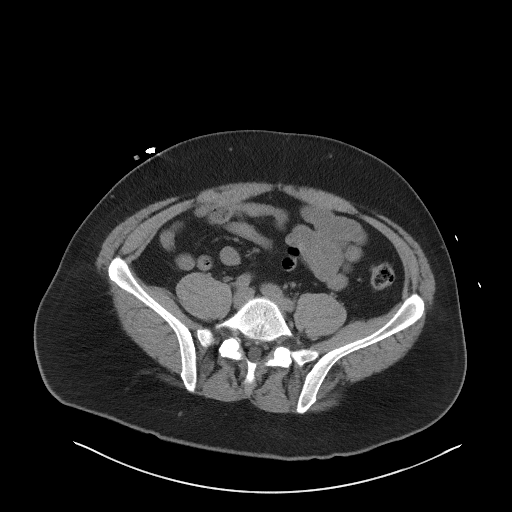
[im 50/114  soft-tissue]
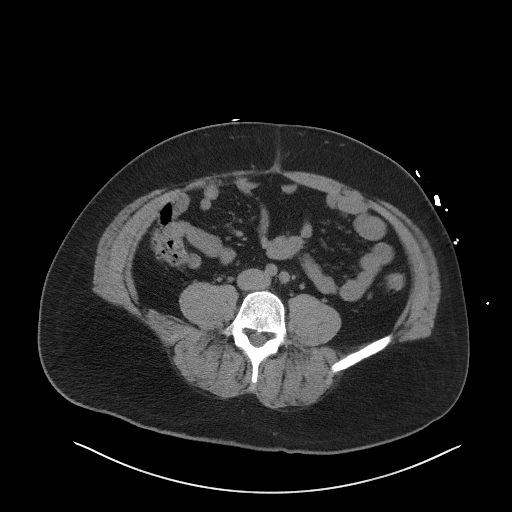
[im 57/114  soft-tissue]
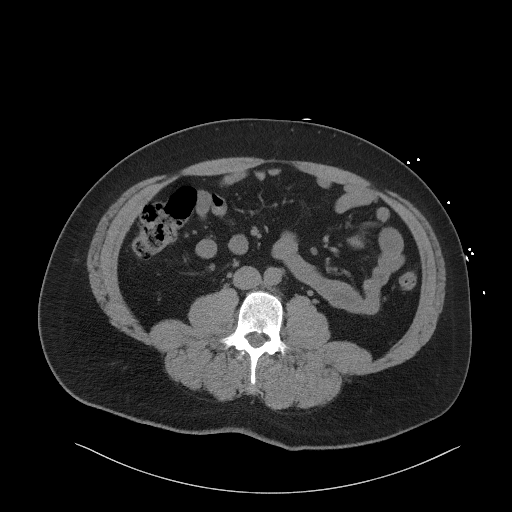
[im 64/114  soft-tissue]
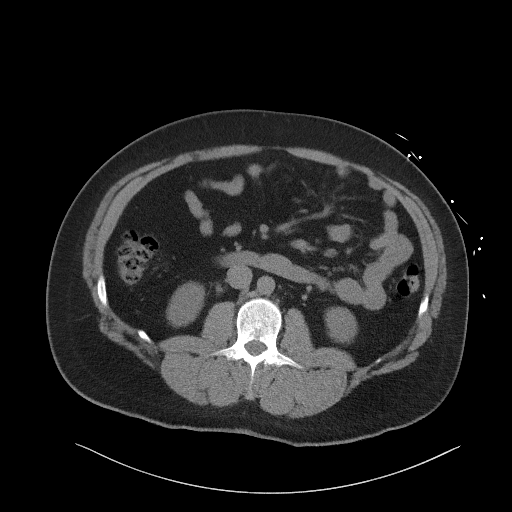
[im 71/114  soft-tissue]
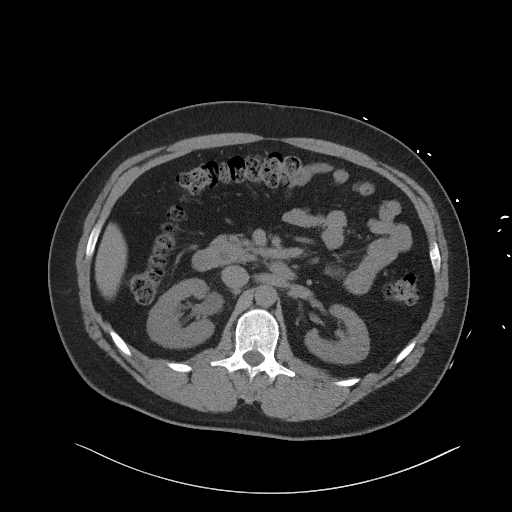
[im 71/114  bone]
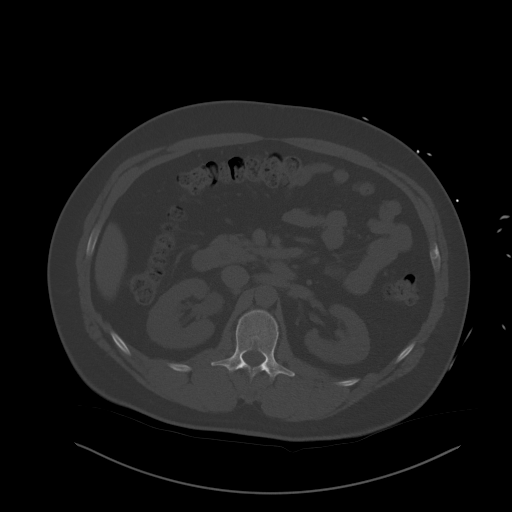
[im 78/114  soft-tissue]
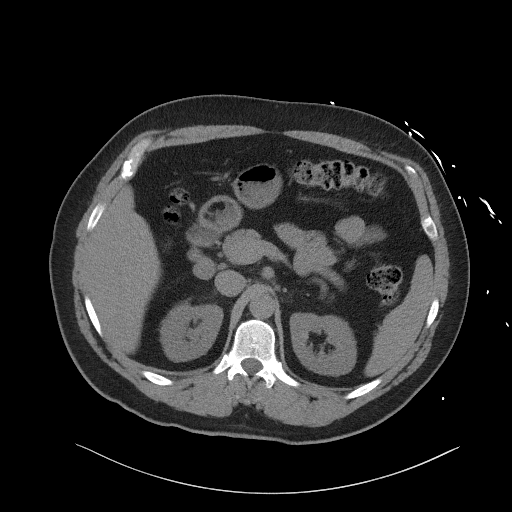
[im 92/114  soft-tissue]
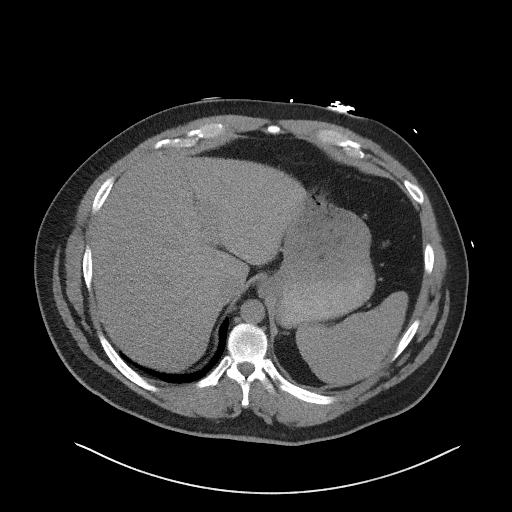
[im 99/114  soft-tissue]
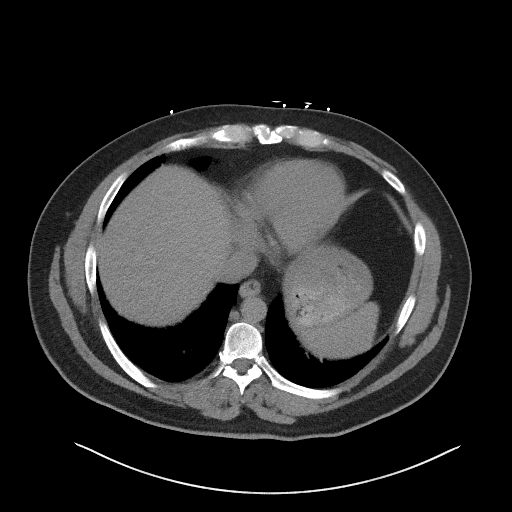
[im 106/114  soft-tissue]
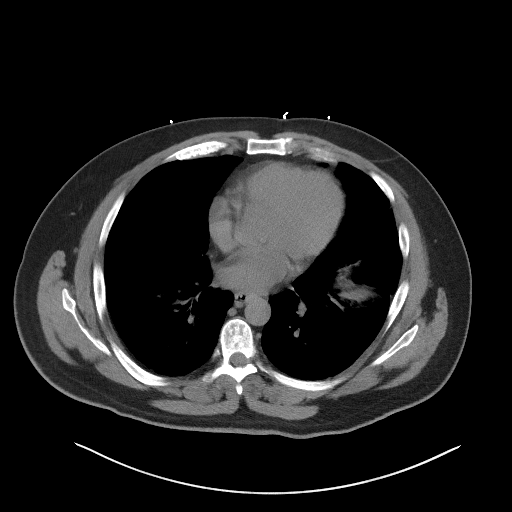

[Series 5: coronal st · coronal · 0.95mm/px · 3 of 101 slices shown]
[im 34/101  soft-tissue]
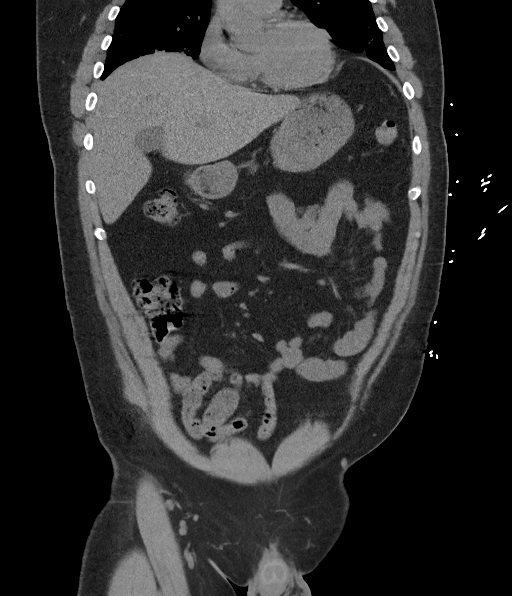
[im 45/101  soft-tissue]
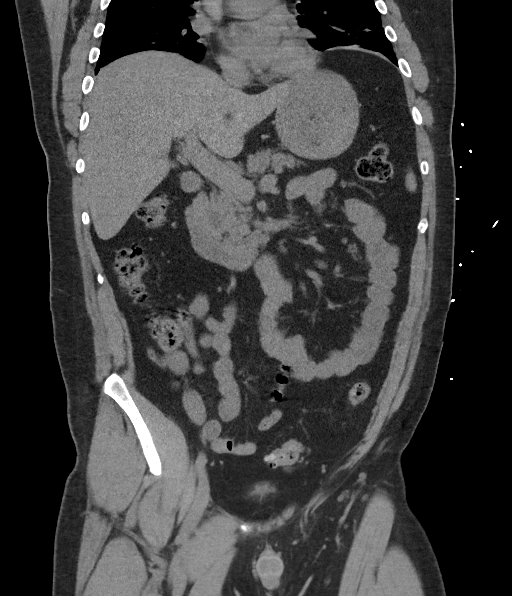
[im 56/101  soft-tissue]
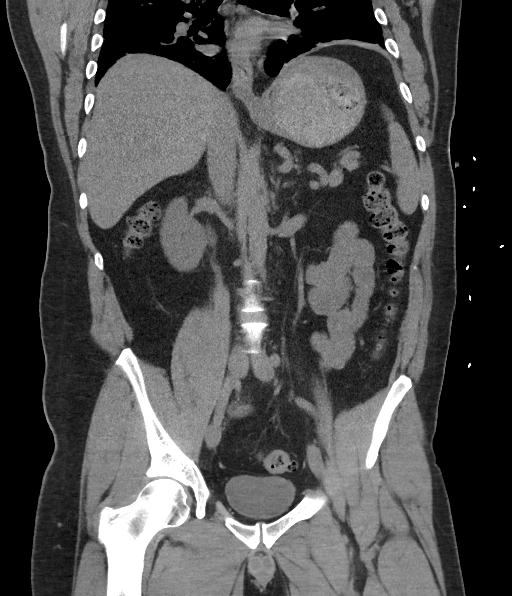

[16 of 46 positions shown; findings below may reference images not displayed]

FINDINGS: Lower chest: There is bibasilar atelectatic change. No edema or
consolidation in the lung bases.

Hepatobiliary: No focal liver lesions are appreciable on this
noncontrast enhanced study. The gallbladder wall is not appreciably
thickened. There is no biliary duct dilatation.

Pancreas: There is no pancreatic mass or inflammatory focus.

Spleen: No splenic lesions are evident. Small splenules are noted
anteriorly.

Adrenals/Urinary Tract: Adrenals bilaterally appear normal. There is
no renal mass on either side. There is mild hydronephrosis on the
right. There is no appreciable hydronephrosis on the left. There is
no intrarenal calculus on either side. There is subtle edema
tracking along the course the right ureter. There is a 1 mm calculus
at the right ureterovesical junction. No other ureteral calculi are
evident. Urinary bladder is midline with wall thickness within
normal limits.

Stomach/Bowel: There are sigmoid diverticula without diverticulitis.
There is no appreciable bowel wall or mesenteric thickening. There
is no appreciable bowel obstruction. The terminal ileum appears
normal. Appendix appears normal. There is no evident free air or
portal venous air.

Vascular/Lymphatic: There is no abdominal aortic aneurysm. No
vascular lesions are evident on this noncontrast enhanced study.
There is no appreciable adenopathy in the abdomen or pelvis.

Reproductive: There are prostatic calculi. Prostate and seminal
vesicles are normal in size and contour.

Other: No evident abscess or ascites in the abdomen or pelvis.

Musculoskeletal: There are no blastic or lytic bone lesions. There
is no intramuscular or abdominal wall lesion.
IMPRESSION: 1. 1 mm calculus right ureterovesical junction with mild
hydronephrosis and ureterectasis on the right. Subtle edema tracking
along the course of the right ureter.

2. Sigmoid diverticula without diverticulitis. No bowel wall
thickening. No bowel obstruction.

3.  No abscess in the abdomen or pelvis.  Appendix appears normal.
